# Patient Record
Sex: Male | Born: 1995
Health system: Southern US, Community
[De-identification: ages and names within clinical notes are randomized; demographics above are authoritative.]

---
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
## 2016-05-10 ENCOUNTER — Emergency Department: Payer: Self-pay

## 2016-05-10 ENCOUNTER — Emergency Department
Admission: EM | Admit: 2016-05-10 | Discharge: 2016-05-10 | Disposition: A | Discharge status: Home / Self Care | Payer: Self-pay | Attending: Emergency Medicine | Admitting: Emergency Medicine

## 2016-05-10 DIAGNOSIS — R0602 Shortness of breath: Secondary | ICD-10-CM

## 2016-05-10 DIAGNOSIS — F419 Anxiety disorder, unspecified: Secondary | ICD-10-CM | POA: Insufficient documentation

## 2016-05-10 DIAGNOSIS — F17200 Nicotine dependence, unspecified, uncomplicated: Secondary | ICD-10-CM | POA: Insufficient documentation

## 2016-05-10 LAB — BASIC METABOLIC PANEL
ANION GAP: 8 (ref 5–15)
BUN: 9 mg/dL (ref 6–20)
CALCIUM: 10.1 mg/dL (ref 8.9–10.3)
CO2: 24 mmol/L (ref 22–32)
CREATININE: 0.84 mg/dL (ref 0.61–1.24)
Chloride: 106 mmol/L (ref 101–111)
Glucose, Bld: 106 mg/dL — ABNORMAL HIGH (ref 65–99)
Potassium: 3.4 mmol/L — ABNORMAL LOW (ref 3.5–5.1)
SODIUM: 138 mmol/L (ref 135–145)

## 2016-05-10 LAB — CBC
HCT: 46.4 % (ref 40.0–52.0)
HEMOGLOBIN: 16.5 g/dL (ref 13.0–18.0)
MCH: 32.1 pg (ref 26.0–34.0)
MCHC: 35.6 g/dL (ref 32.0–36.0)
MCV: 90.3 fL (ref 80.0–100.0)
PLATELETS: 227 10*3/uL (ref 150–440)
RBC: 5.14 MIL/uL (ref 4.40–5.90)
RDW: 12.8 % (ref 11.5–14.5)
WBC: 11.8 10*3/uL — AB (ref 3.8–10.6)

## 2016-05-10 LAB — TROPONIN I

## 2016-05-10 MED ORDER — LORAZEPAM 0.5 MG PO TABS: 0.5000 mg | ORAL_TABLET | Freq: Three times a day (TID) | ORAL | 0 refills | Status: AC | PRN

## 2016-05-10 NOTE — ED Triage Notes (Addendum)
Pt arrives to ER via POV c/o SOB that is worse in the morning X 1 week. Pt states SOB worsening with exertion. Pt states 1 episode of this that he was evaluated for previously. Intermittent chest tightness prior to arrival, none at this time. Pt alert and oriented X4, active, cooperative, pt in NAD. RR even and unlabored, color WNL.

## 2016-05-10 NOTE — ED Provider Notes (Signed)
[facility] Emergency Department Provider Note  Time seen: 7:15 PM  I have reviewed the triage vital signs and the nursing notes.   HISTORY  Chief Complaint Shortness of Breath    HPI Charles Hughes is a 20 y.o. male with no past medical history who presents the emergency department for intermittent shortness of breath. According to the patient for the past several weeks he will occasionally become short of breath, get tingling sensations in his hands and feet sometimes accompanied with sweating and feeling like he is going to pass out. Denies any heart palpitations or racing. Denies any chest pain. Mom has a history of anxiety, and believes the patient could be suffering from panic attacks.  History reviewed. No pertinent past medical history.  There are no active problems to display for this patient.   History reviewed. No pertinent surgical history.  Prior to Admission medications   Not on File    No Known Allergies  No family history on file.  Social History Social History  Substance Use Topics  . Smoking status: Current Every Day Smoker  . Smokeless tobacco: Current User  . Alcohol use No    Review of Systems Constitutional: Negative for fever. Cardiovascular: Negative for chest pain. Respiratory: Intermittent shortness of breath. Gastrointestinal: Negative for abdominal pain Musculoskeletal: Negative for back pain. Neurological: Negative for headaches, focal weakness or numbness. 10-point ROS otherwise negative.  ____________________________________________   PHYSICAL EXAM:  VITAL SIGNS: ED Triage Vitals [05/10/16 1742]  Enc Vitals Group     BP (!) 137/103     Pulse Rate 71     Resp 20     Temp 97.8 F (36.6 C)     Temp Source Oral     SpO2 100 %     Weight 140 lb (63.5 kg)     Height 5\' 10"  (1.778 m)     Head Circumference      Peak Flow      Pain Score 0     Pain Loc      Pain Edu?      Excl. in GC?      Constitutional: Alert and oriented. Well appearing and in no distress. Eyes: Normal exam ENT   Head: Normocephalic and atraumatic   Mouth/Throat: Mucous membranes are moist. Cardiovascular: Normal rate, regular rhythm. No murmur Respiratory: Normal respiratory effort without tachypnea nor retractions. Breath sounds are clear Gastrointestinal: Soft and nontender. No distention.   Musculoskeletal: Nontender with normal range of motion in all extremities. No lower extremity tenderness or edema. Neurologic:  Normal speech and language. No gross focal neurologic deficits are appreciated. Skin:  Skin is warm, dry and intact.  Psychiatric: Mood and affect are normal. Speech and behavior are normal.   ____________________________________________    EKG  EKG reviewed and interpreted by myself shows normal sinus rhythm at 60 bpm, narrow QRS, normal axis, normal intervals, no concerning ST changes. Normal EKG.  ____________________________________________    RADIOLOGY  Chest x-ray clear  ____________________________________________   INITIAL IMPRESSION / ASSESSMENT AND PLAN / ED COURSE  Pertinent labs & imaging results that were available during my care of the patient were reviewed by me and considered in my medical decision making (see chart for details).  Patient says emergency department intermittent shortness of breath. Patient is perc negative, labs are normal, troponin negative, chest x-ray and EKG are reassuring. Patient's symptoms could very likely be anxiety related. I discussed with the patient a trial of Ativan to  be used only as needed. I also discussed following up with his primary care doctor, mom states he has appointment on Monday. We will discharge the patient home with PCP follow-up.  ____________________________________________   FINAL CLINICAL IMPRESSION(S) / ED DIAGNOSES  Anxiety Shortness of breath    Minna Antis, MD 05/10/16 1918

## 2017-06-05 ENCOUNTER — Emergency Department
Admission: EM | Admit: 2017-06-05 | Discharge: 2017-06-05 | Disposition: A | Discharge status: Home / Self Care | Payer: Self-pay | Attending: Emergency Medicine | Admitting: Emergency Medicine

## 2017-06-05 ENCOUNTER — Encounter: Payer: Self-pay | Admitting: Emergency Medicine

## 2017-06-05 DIAGNOSIS — F17200 Nicotine dependence, unspecified, uncomplicated: Secondary | ICD-10-CM | POA: Insufficient documentation

## 2017-06-05 DIAGNOSIS — W230XXA Caught, crushed, jammed, or pinched between moving objects, initial encounter: Secondary | ICD-10-CM | POA: Insufficient documentation

## 2017-06-05 DIAGNOSIS — S93402A Sprain of unspecified ligament of left ankle, initial encounter: Secondary | ICD-10-CM | POA: Insufficient documentation

## 2017-06-05 DIAGNOSIS — Y999 Unspecified external cause status: Secondary | ICD-10-CM | POA: Insufficient documentation

## 2017-06-05 DIAGNOSIS — Y939 Activity, unspecified: Secondary | ICD-10-CM | POA: Insufficient documentation

## 2017-06-05 DIAGNOSIS — Y929 Unspecified place or not applicable: Secondary | ICD-10-CM | POA: Insufficient documentation

## 2017-06-05 MED ORDER — IBUPROFEN 800 MG PO TABS: 800.0000 mg | ORAL_TABLET | Freq: Three times a day (TID) | ORAL | 0 refills | Status: DC | PRN

## 2017-06-05 NOTE — ED Provider Notes (Signed)
[facility] Emergency Department Provider Note   ____________________________________________   I have reviewed the triage vital signs and the nursing notes.   HISTORY  Chief Complaint Foot Pain    HPI Charles Hughes is a 21 y.o. male presents to the emergency department with left foot pain. Patient reports his foot getting caught and twisted in a rolling metal cart 3 days ago. Patient reports left foot pain with ecchymosis and mild swelling. He states initially after the injury significant swelling that improved after 48 hours. 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 Patient denies any sensation changes, loss of strength or unsteadiness. Patient denies fever, chills, headache, vision changes, chest pain, chest tightness, shortness of breath, abdominal pain, nausea and vomiting.  No past medical history on file.  There are no active problems to display for this patient.   History reviewed. No pertinent surgical history.  Prior to Admission medications   Medication Sig Start Date End Date Taking? Authorizing Provider  ibuprofen (ADVIL,MOTRIN) 800 MG tablet Take 1 tablet (800 mg total) by mouth every 8 (eight) hours as needed. 06/05/17   Little, Traci M, PA-C    Allergies Patient has no known allergies.  No family history on file.  Social History Social History  Substance Use Topics  . Smoking status: Current Every Day Smoker  . Smokeless tobacco: Current User  . Alcohol use No    Review of Systems Constitutional: Negative for fever/chills Eyes: No visual changes. ENT:  Negative for sore throat and for difficulty swallowing Cardiovascular: Denies chest pain. Respiratory: Denies cough. Denies shortness of breath. Gastrointestinal: No abdominal pain.  No nausea, vomiting, diarrhea. Genitourinary: Negative  for dysuria. Musculoskeletal: Left ankle pain with swelling and ecchymosis Skin: Negative for rash. Neurological: Negative for headaches.  Negative focal weakness or numbness. Negative for loss of consciousness. Able to ambulate. ____________________________________________   PHYSICAL EXAM:  VITAL SIGNS: ED Triage Vitals  Enc Vitals Group     BP 06/05/17 1407 98/75     Pulse Rate 06/05/17 1407 77     Resp 06/05/17 1407 20     Temp 06/05/17 1407 98.7 F (37.1 C)     Temp Source 06/05/17 1407 Oral     SpO2 06/05/17 1407 100 %     Weight 06/05/17 1408 140 lb (63.5 kg)     Height 06/05/17 1408 5\' 11"  (1.803 m)     Head Circumference --      Peak Flow --      Pain Score 06/05/17 1409 8     Pain Loc --      Pain Edu? --      Excl. in GC? --     Constitutional: Alert and oriented. Well appearing and in no acute distress.  Eyes: Conjunctivae are normal. PERRL. EOMI  Head: Normocephalic and atraumatic. ENT:      Ears: Canals clear. TMs intact bilaterally.      Nose: No congestion/rhinnorhea.      Mouth/Throat: Mucous membranes are moist.  Neck:Supple. No thyromegaly. No stridor.  Cardiovascular: Normal rate, regular rhythm. Normal S1 and S2.  Good peripheral circulation. Respiratory: Normal respiratory effort without tachypnea or retractions. Lungs CTAB. No wheezes/rales/rhonchi.  Hematological/Lymphatic/Immunological: No cervical lymphadenopathy. Cardiovascular: Normal rate, regular rhythm. Normal distal pulses. Gastrointestinal: Bowel sounds 4 quadrants. Soft and nontender to palpation. Musculoskeletal: Left ankle  pain localized  Along medial and lateral malleoli with ecchymosis. Intact left ankle range of motion in all planes, sensation and strength. Neurologic: Normal speech and language. No gross focal neurologic deficits are appreciated. No gait instability.  Skin:  Skin is warm, dry and intact. No rash noted. Psychiatric: Mood and affect are normal. Speech and behavior are  normal. Patient exhibits appropriate insight and judgement.  ____________________________________________   LABS (all labs ordered are listed, but only abnormal results are displayed)  Labs Reviewed - No data to display ____________________________________________  EKG none ____________________________________________  RADIOLOGY none ____________________________________________   PROCEDURES  Procedure(s) performed: no    Critical Care performed: no ____________________________________________   INITIAL IMPRESSION / ASSESSMENT AND PLAN / ED COURSE  Pertinent labs & imaging results that were available during my care of the patient were reviewed by me and considered in my medical decision making (see chart for details).   Patient presents to emergency department with left ankle pain, swelling and ecchymosis. History and physical exam findings are reassuring symptoms are consistent with ankle sprain. He should advised to use a lace up to urinate ankle brace for support during walking and mobility activities. Also utilize elevation and ice for symptom management. Patient will be prescribed ibuprofen for pain and inflammation.. Patient advised to follow up with PCP as needed or return to the emergency department if symptoms return or worsen. Patient informed of clinical course, understand medical decision-making process, and agree with plan.    ____________________________________________   FINAL CLINICAL IMPRESSION(S) / ED DIAGNOSES  Final diagnoses:  Sprain of left ankle, unspecified ligament, initial encounter       NEW MEDICATIONS STARTED DURING THIS VISIT:  Discharge Medication List as of 06/05/2017  2:59 PM    START taking these medications   Details  ibuprofen (ADVIL,MOTRIN) 800 MG tablet Take 1 tablet (800 mg total) by mouth every 8 (eight) hours as needed., Starting Tue 06/05/2017, Print         Note:  This document was prepared using Dragon voice  recognition software and may include unintentional dictation errors.    Clois ComberLittle, Traci M, PA-C 06/05/17 1550    Jene EveryKinner, Robert, MD 06/08/17 [phone]

## 2017-06-05 NOTE — ED Notes (Signed)
AAOx3.  Skin warm and dry. Eccymosis seen to left ankle medially and laterally.  Ambulates with easy and steady gait.  NAD

## 2017-06-05 NOTE — ED Notes (Signed)
AAOx3.  Skin warm and dry. NAD.  Ambulates with easy and steady gait. NAD °

## 2017-06-05 NOTE — ED Triage Notes (Signed)
Pt c/o left foot pain since Saturday. Ambulates without difficulty.

## 2017-06-05 NOTE — Discharge Instructions (Signed)
Utilize cold pack, rest and elevation to reduce swelling.  You may use an ankle lace up brace to assist with ankle support when returning to walking and working activities.

## 2017-09-13 ENCOUNTER — Other Ambulatory Visit: Payer: Self-pay

## 2017-09-13 ENCOUNTER — Emergency Department: Payer: Self-pay

## 2017-09-13 ENCOUNTER — Emergency Department
Admission: EM | Admit: 2017-09-13 | Discharge: 2017-09-13 | Disposition: A | Discharge status: Home / Self Care | Payer: Self-pay | Attending: Emergency Medicine | Admitting: Emergency Medicine

## 2017-09-13 DIAGNOSIS — Z87891 Personal history of nicotine dependence: Secondary | ICD-10-CM | POA: Insufficient documentation

## 2017-09-13 DIAGNOSIS — N503 Cyst of epididymis: Secondary | ICD-10-CM | POA: Insufficient documentation

## 2017-09-13 DIAGNOSIS — N50812 Left testicular pain: Secondary | ICD-10-CM

## 2017-09-13 LAB — URINALYSIS, COMPLETE (UACMP) WITH MICROSCOPIC
BACTERIA UA: NONE SEEN
Bilirubin Urine: NEGATIVE
GLUCOSE, UA: NEGATIVE mg/dL
Hgb urine dipstick: NEGATIVE
Ketones, ur: NEGATIVE mg/dL
Leukocytes, UA: NEGATIVE
Nitrite: NEGATIVE
PROTEIN: NEGATIVE mg/dL
Specific Gravity, Urine: 1.024 (ref 1.005–1.030)
Squamous Epithelial / LPF: NONE SEEN
WBC UA: NONE SEEN WBC/hpf (ref 0–5)
pH: 6 (ref 5.0–8.0)

## 2017-09-13 NOTE — ED Notes (Signed)
First nurse note  Presents with some pain /swelling to groin

## 2017-09-13 NOTE — Discharge Instructions (Signed)
Follow-up with the urologist, his phone number has been given to you, use ibuprofen as needed for pain, apply ice to the area, return if your worsening

## 2017-09-13 NOTE — ED Provider Notes (Signed)
[facility] Emergency Department Provider Note  ____________________________________________   First MD Initiated Contact with Patient 09/13/17 1319     (approximate)  I have reviewed the triage vital signs and the nursing notes.   HISTORY  Chief Complaint Groin Swelling    HPI Charles Hughes is a 21 y.o. male states he has had a problem with this over the years, it started when he was about 21 years old, denies fever or chills complains of left testicular swelling with some pain,, denies penile discharge or any chance of STD at this time patient   History reviewed. No pertinent past medical history.  There are no active problems to display for this patient.   History reviewed. No pertinent surgical history.  Prior to Admission medications   Medication Sig Start Date End Date Taking? Authorizing Provider  ibuprofen (ADVIL,MOTRIN) 800 MG tablet Take 1 tablet (800 mg total) by mouth every 8 (eight) hours as needed. 06/05/17   Little, Traci M, PA-C    Allergies Patient has no known allergies.  No family history on file.  Social History Social History   Tobacco Use  . Smoking status: Former Games developermoker  . Smokeless tobacco: Current User  Substance Use Topics  . Alcohol use: No  . Drug use: Not on file    Review of Systems  Constitutional: No fever/chills Eyes: No visual changes. ENT: No sore throat. Respiratory: Denies cough Genitourinary: Negative for dysuria.  Positive for swelling of the left testicle patient Musculoskeletal: Negative for back pain. Skin: Negative for rash.    ____________________________________________   PHYSICAL EXAM:  VITAL SIGNS: ED Triage Vitals  Enc Vitals Group     BP 09/13/17 0842 (!) 143/87     Pulse Rate 09/13/17 0842 (!) 115     Resp 09/13/17 0842 16     Temp 09/13/17 0842 98.1 F (36.7 C)     Temp Source 09/13/17 0842 Oral     SpO2 09/13/17 0842 100 %     Weight 09/13/17 0843 140 lb (63.5 kg)     Height 09/13/17 0843 5\' 11"  (1.803 m)     Head Circumference --      Peak Flow --      Pain Score 09/13/17 0842 8     Pain Loc --      Pain Edu? --      Excl. in GC? --     Constitutional: Alert and oriented. Well appearing and in no acute distress. Eyes: Conjunctivae are normal.  Head: Atraumatic. Nose: No congestion/rhinnorhea. Mouth/Throat: Mucous membranes are moist.   Cardiovascular: Normal rate, regular rhythm.  Heart sounds are normal Respiratory: Normal respiratory effort.  No retractions lungs are clear to auscultation,  GU: .  The left testicle is minimally swollen, is little tender, no abscesses noted no penile discharge is noted Musculoskeletal: FROM all extremities, warm and well perfused Neurologic:  Normal speech and language.  Skin:  Skin is warm, dry and intact. No rash noted. Psychiatric: Mood and affect are normal. Speech and behavior are normal.  ____________________________________________   LABS (all labs ordered are listed, but only abnormal results are displayed)  Labs Reviewed  URINALYSIS, COMPLETE (UACMP) WITH MICROSCOPIC - Abnormal; Notable for the following components:      Result Value   Color, Urine YELLOW (*)    APPearance CLEAR (*)    All other components within normal limits   ____________________________________________   ____________________________________________  RADIOLOGY  Ultrasound of the scrotum shows a  small cyst on the head of the epididymal  ____________________________________________   PROCEDURES  Procedure(s) performed: No      ____________________________________________   INITIAL IMPRESSION / ASSESSMENT AND PLAN / ED COURSE  Pertinent labs & imaging results that were available during my care of the patient were reviewed by me and considered in my medical decision making (see chart for details).  Patient is a 21 year old male with a epididymal cyst, he is to follow-up with urology, he is to call for an  appointment, his ultrasound and lab work were negative,  all I explained the results to him, he states he understands and will comply with our instructions to follow-up with urology, he was discharged in stable condition     ____________________________________________   FINAL CLINICAL IMPRESSION(S) / ED DIAGNOSES  Final diagnoses:  Testicular pain, left  Epididymal cyst      NEW MEDICATIONS STARTED DURING THIS VISIT:  This SmartLink is deprecated. Use AVSMEDLIST instead to display the medication list for a patient.   Note:  This document was prepared using Dragon voice recognition software and may include unintentional dictation errors.    Faythe GheeFisher, John Williamsen W, PA-C 09/13/17 1611    Nita SickleVeronese, [place], MD 09/14/17 2056

## 2017-09-13 NOTE — ED Triage Notes (Signed)
Pt c/o intermittent left testicle pain with swelling for the past 2 days. States he had this problem when he was about 13 with the same testicle.

## 2017-09-21 ENCOUNTER — Encounter: Payer: Self-pay | Admitting: Urology

## 2017-09-21 ENCOUNTER — Ambulatory Visit (INDEPENDENT_AMBULATORY_CARE_PROVIDER_SITE_OTHER): Payer: Self-pay | Admitting: Urology

## 2017-09-21 VITALS — BP 102/64 | HR 97 | Ht 71.0 in | Wt 140.0 lb

## 2017-09-21 DIAGNOSIS — I861 Scrotal varices: Secondary | ICD-10-CM

## 2017-09-21 DIAGNOSIS — N5082 Scrotal pain: Secondary | ICD-10-CM | POA: Insufficient documentation

## 2017-09-21 NOTE — Progress Notes (Signed)
09/21/2017 2:46 PM   Charles Hughes [birth year] [id number]  Referring provider: No referring provider defined for this encounter.  Chief Complaint  Patient presents with  . scrotal cyst    new patient    HPI: 21 year old male referred from the emergency department for evaluation of a left scrotal pain.  He was seen in the ED on 09/13/2017 complaining of onset of left scrotal pain and swelling.  He had a scrotal sonogram which showed normal blood flow bilaterally.  He had a small left epididymal cyst measuring 3 mm.  The patient states he was told that his epididymal cyst was causing his pain and was referred to urology.  He states shortly after puberty he began to have intermittent left scrotal pain and states he was told it was due to "fluid buildup".  His episode significantly decreased however he has intermittent episodes of left scrotal pain which will usually last from 1-4 days.  The pain is described as mild to moderate.  He does note left hemiscrotal swelling and states his scrotum will occasionally be swollen to twice the normal size.  His exam in the ED is noted as the left testicle is minimally swollen however ultrasonic size was normal.  His pain has resolved.   PMH: No past medical history on file.  Surgical History: No past surgical history on file.  Home Medications:  Allergies as of 09/21/2017   No Known Allergies     Medication List    as of 09/21/2017  2:46 PM   You have not been prescribed any medications.     Allergies: No Known Allergies  Family History: No family history on file.  Social History:  reports that he has quit smoking. He uses smokeless tobacco. He reports that he does not drink alcohol. His drug history is not on file.  ROS: UROLOGY Frequent Urination?: Yes Hard to postpone urination?: No Burning/pain with urination?: No Get up at night to urinate?: No Leakage of urine?: No Urine stream starts and stops?: No Trouble starting  stream?: Yes Do you have to strain to urinate?: No Blood in urine?: No Urinary tract infection?: No Sexually transmitted disease?: No Injury to kidneys or bladder?: No Painful intercourse?: No Weak stream?: No Erection problems?: Yes Penile pain?: No  Gastrointestinal Nausea?: No Vomiting?: No Indigestion/heartburn?: Yes Diarrhea?: No Constipation?: No  Constitutional Fever: No Night sweats?: No Weight loss?: Yes Fatigue?: No  Skin Skin rash/lesions?: No Itching?: No  Eyes Blurred vision?: No Double vision?: No  Ears/Nose/Throat Sore throat?: No Sinus problems?: No  Hematologic/Lymphatic Swollen glands?: No Easy bruising?: No  Cardiovascular Leg swelling?: No Chest pain?: No  Respiratory Cough?: No Shortness of breath?: No  Endocrine Excessive thirst?: No  Musculoskeletal Back pain?: No Joint pain?: No  Neurological Headaches?: No Dizziness?: No  Psychologic Depression?: No Anxiety?: No  Physical Exam: BP 102/64   Pulse 97   Ht 5\' 11"  (1.803 m)   Wt 140 lb (63.5 kg)   BMI 19.53 kg/m   Constitutional:  Alert and oriented, No acute distress. HEENT: [place] AT, moist mucus membranes.  Trachea midline, no masses. Cardiovascular: No clubbing, cyanosis, or edema. Respiratory: Normal respiratory effort, no increased work of breathing. GI: Abdomen is soft, nontender, nondistended, no abdominal masses GU: No CVA tenderness.  Penis circumcised without lesions.  Testes descended bilaterally without masses or tenderness.  epididymes palpably normal bilaterally.  Moderate left varicocele. Skin: No rashes, bruises or suspicious lesions. Lymph: No cervical or inguinal adenopathy. Neurologic:  Grossly intact, no focal deficits, moving all 4 extremities. Psychiatric: Normal mood and affect.  Laboratory Data: Lab Results  Component Value Date   WBC 11.8 (H) 05/10/2016   HGB 16.5 05/10/2016   HCT 46.4 05/10/2016   MCV 90.3 05/10/2016   PLT 227 05/10/2016     Lab Results  Component Value Date   CREATININE 0.84 05/10/2016    Pertinent Imaging: Scrotal ultrasound was reviewed.  The left testis is actually slightly smaller than the right.   Assessment & Plan:   He was reassured that his scrotal ultrasound showed no significant abnormalities and that the 3 mm cyst would not be a cause of scrotal pain and does not require treatment.  I did discuss the possibility of varicocele related pain however recommend he return for a repeat exam when he is symptomatic with pain and swelling.  1. Scrotal pain   2. Varicocele    Riki AltesScott C Nastasha Reising, MD  [facility] [address], [address] [place], [place] [id number] [phone]

## 2019-07-08 ENCOUNTER — Other Ambulatory Visit: Payer: Self-pay

## 2019-07-08 ENCOUNTER — Encounter: Payer: Self-pay | Admitting: Emergency Medicine

## 2019-07-08 ENCOUNTER — Emergency Department: Payer: Self-pay

## 2019-07-08 DIAGNOSIS — Z87891 Personal history of nicotine dependence: Secondary | ICD-10-CM | POA: Insufficient documentation

## 2019-07-08 DIAGNOSIS — J40 Bronchitis, not specified as acute or chronic: Secondary | ICD-10-CM | POA: Insufficient documentation

## 2019-07-08 NOTE — ED Triage Notes (Signed)
Patient ambulatory to triage with steady gait, without difficulty or distress noted, mask in place; pt reports since last night having prod cough with some blood noted; denies fever; also reports painful knot to left lower chest, tender to touch with no known injury, st that he believes it is another cyst

## 2019-07-09 ENCOUNTER — Emergency Department
Admission: EM | Admit: 2019-07-09 | Discharge: 2019-07-09 | Disposition: A | Discharge status: Home / Self Care | Payer: Self-pay | Attending: Emergency Medicine | Admitting: Emergency Medicine

## 2019-07-09 ENCOUNTER — Other Ambulatory Visit: Payer: Self-pay

## 2019-07-09 ENCOUNTER — Emergency Department: Payer: Self-pay

## 2019-07-09 DIAGNOSIS — J40 Bronchitis, not specified as acute or chronic: Secondary | ICD-10-CM

## 2019-07-09 LAB — CBC WITH DIFFERENTIAL/PLATELET
Abs Immature Granulocytes: 0.02 10*3/uL (ref 0.00–0.07)
Basophils Absolute: 0.1 10*3/uL (ref 0.0–0.1)
Basophils Relative: 1 %
Eosinophils Absolute: 0.4 10*3/uL (ref 0.0–0.5)
Eosinophils Relative: 5 %
HCT: 47.1 % (ref 39.0–52.0)
Hemoglobin: 16.4 g/dL (ref 13.0–17.0)
Immature Granulocytes: 0 %
Lymphocytes Relative: 29 %
Lymphs Abs: 2.2 10*3/uL (ref 0.7–4.0)
MCH: 31.5 pg (ref 26.0–34.0)
MCHC: 34.8 g/dL (ref 30.0–36.0)
MCV: 90.4 fL (ref 80.0–100.0)
Monocytes Absolute: 0.7 10*3/uL (ref 0.1–1.0)
Monocytes Relative: 10 %
Neutro Abs: 4.1 10*3/uL (ref 1.7–7.7)
Neutrophils Relative %: 55 %
Platelets: 216 10*3/uL (ref 150–400)
RBC: 5.21 MIL/uL (ref 4.22–5.81)
RDW: 12 % (ref 11.5–15.5)
WBC: 7.4 10*3/uL (ref 4.0–10.5)
nRBC: 0 % (ref 0.0–0.2)

## 2019-07-09 LAB — BASIC METABOLIC PANEL
Anion gap: 8 (ref 5–15)
BUN: 13 mg/dL (ref 6–20)
CO2: 28 mmol/L (ref 22–32)
Calcium: 9.2 mg/dL (ref 8.9–10.3)
Chloride: 102 mmol/L (ref 98–111)
Creatinine, Ser: 0.86 mg/dL (ref 0.61–1.24)
GFR calc Af Amer: >60 mL/min (ref >60)
GFR calc non Af Amer: >60 mL/min (ref >60)
Glucose, Bld: 86 mg/dL (ref 70–99)
Potassium: 3.7 mmol/L (ref 3.5–5.1)
Sodium: 138 mmol/L (ref 135–145)

## 2019-07-09 LAB — TROPONIN I (HIGH SENSITIVITY): Troponin I (High Sensitivity): <2 ng/L (ref <18)

## 2019-07-09 MED ORDER — IOHEXOL 350 MG/ML SOLN
100.0000 mL | Freq: Once | INTRAVENOUS | Status: AC | PRN
  Administered 2019-07-09: 100 mL via INTRAVENOUS

## 2019-07-09 MED ORDER — ALBUTEROL SULFATE HFA 108 (90 BASE) MCG/ACT IN AERS: 2.0000 | INHALATION_SPRAY | Freq: Four times a day (QID) | RESPIRATORY_TRACT | 0 refills | Status: DC | PRN

## 2019-07-09 MED ORDER — PREDNISONE 20 MG PO TABS
60.0000 mg | ORAL_TABLET | Freq: Once | ORAL | Status: AC
  Administered 2019-07-09: 60 mg via ORAL
  Filled 2019-07-09: qty 3

## 2019-07-09 MED ORDER — PREDNISONE 20 MG PO TABS: 60.0000 mg | ORAL_TABLET | Freq: Every day | ORAL | 0 refills | Status: AC

## 2019-07-09 NOTE — ED Provider Notes (Signed)
[facility] Emergency Department Provider Note  ____________________________________________  Time seen: Approximately 12:51 AM  I have reviewed the triage vital signs and the nursing notes.   HISTORY  Chief Complaint Cough   HPI Charles Hughes is a 23 y.o. male presents for evaluation of hemoptysis.  Patient reports intermittent left-sided chest pressure.  Has had a mild cough.  Yesterday he reports bringing up a tablespoon of sputum which was full with blood.  No blood clots.  Today he reports that he sputum looks brown but has not seen any further blood in it.  He has no chest pain at this time.  No personal or family history of blood clots, recent travel immobilization, leg pain or swelling, or exogenous hormones.  Patient is a former smoker and stopped 2 years ago.  He denies drug use or alcohol.  He denies vomiting, abdominal pain, melena, shortness of breath, fever, loss of taste or smell, body aches, sore throat.   History reviewed. No pertinent past medical history.  Patient Active Problem List   Diagnosis Date Noted  . Scrotal pain 09/21/2017  . Varicocele 09/21/2017    History reviewed. No pertinent surgical history.  Prior to Admission medications   Medication Sig Start Date End Date Taking? Authorizing Provider  albuterol (VENTOLIN HFA) 108 (90 Base) MCG/ACT inhaler Inhale 2 puffs into the lungs every 6 (six) hours as needed for wheezing or shortness of breath. 07/09/19   Nita Sickle, MD  predniSONE (DELTASONE) 20 MG tablet Take 3 tablets (60 mg total) by mouth daily for 4 days. 07/09/19 07/13/19  Nita Sickle, MD    Allergies Patient has no known allergies.  No family history on file.  Social History Social History   Tobacco Use  . Smoking status: Former Games developer  . Smokeless tobacco: Current User  Substance Use Topics  . Alcohol use: No  . Drug use: Not on file    Review of Systems  Constitutional: Negative for  fever. Eyes: Negative for visual changes. ENT: Negative for sore throat. Neck: No neck pain  Cardiovascular: Negative for chest pain. Respiratory: Negative for shortness of breath. + hemoptysis Gastrointestinal: Negative for abdominal pain, vomiting or diarrhea. Genitourinary: Negative for dysuria. Musculoskeletal: Negative for back pain. Skin: Negative for rash. Neurological: Negative for headaches, weakness or numbness. Psych: No SI or HI  ____________________________________________   PHYSICAL EXAM:  VITAL SIGNS: ED Triage Vitals  Enc Vitals Group     BP 07/08/19 2213 119/88     Pulse Rate 07/08/19 2213 81     Resp 07/08/19 2213 18     Temp 07/08/19 2213 98.4 F (36.9 C)     Temp Source 07/08/19 2213 Oral     SpO2 07/08/19 2213 99 %     Weight 07/08/19 2213 150 lb (68 kg)     Height 07/08/19 2213 5\' 10"  (1.778 m)     Head Circumference --      Peak Flow --      Pain Score 07/08/19 2212 6     Pain Loc --      Pain Edu? --      Excl. in [place]? --     Constitutional: Alert and oriented. Well appearing and in no apparent distress. HEENT:      Head: Normocephalic and atraumatic.         Eyes: Conjunctivae are normal. Sclera is non-icteric.       Mouth/Throat: Mucous membranes are moist.  Neck: Supple with no signs of meningismus. Cardiovascular: Regular rate and rhythm. No murmurs, gallops, or rubs. 2+ symmetrical distal pulses are present in all extremities. No JVD. Respiratory: Normal respiratory effort. Lungs are clear to auscultation bilaterally. No wheezes, crackles, or rhonchi.  Gastrointestinal: Soft, non tender, and non distended with positive bowel sounds. No rebound or guarding. Musculoskeletal: Nontender with normal range of motion in all extremities. No edema, cyanosis, or erythema of extremities. Neurologic: Normal speech and language. Face is symmetric. Moving all extremities. No gross focal neurologic deficits are appreciated. Skin: Skin is warm, dry  and intact. No rash noted. Psychiatric: Mood and affect are normal. Speech and behavior are normal.  ____________________________________________   LABS (all labs ordered are listed, but only abnormal results are displayed)  Labs Reviewed  CBC WITH DIFFERENTIAL/PLATELET  BASIC METABOLIC PANEL  TROPONIN I (HIGH SENSITIVITY)   ____________________________________________  EKG  ED ECG REPORT I, Nita Sicklearolina Eames Dibiasio, the attending physician, personally viewed and interpreted this ECG.  Normal sinus rhythm, rate of 76, normal intervals, normal axis, no ST elevations or depressions. ____________________________________________  RADIOLOGY  I have personally reviewed the images performed during this visit and I agree with the Radiologist's read.   Interpretation by Radiologist:  Dg Chest 2 View  Result Date: 07/08/2019 CLINICAL DATA:  Cough EXAM: CHEST - 2 VIEW COMPARISON:  May 10, 2016 FINDINGS: The heart size and mediastinal contours are within normal limits. Both lungs are clear. The visualized skeletal structures are unremarkable. IMPRESSION: No acute cardiopulmonary process. Electronically Signed   By: Jonna ClarkBindu  Avutu M.D.   On: 07/08/2019 22:52   Ct Angio Chest Pe W And/or Wo Contrast  Result Date: 07/09/2019 CLINICAL DATA:  Hemoptysis cough EXAM: CT ANGIOGRAPHY CHEST WITH CONTRAST TECHNIQUE: Multidetector CT imaging of the chest was performed using the standard protocol during bolus administration of intravenous contrast. Multiplanar CT image reconstructions and MIPs were obtained to evaluate the vascular anatomy. CONTRAST:  100mL OMNIPAQUE IOHEXOL 350 MG/ML SOLN COMPARISON:  Chest x-ray 07/08/2019 FINDINGS: Cardiovascular: Satisfactory opacification of the pulmonary arteries to the segmental level. No evidence of pulmonary embolism. Normal heart size. No pericardial effusion. Nonaneurysmal aorta. Mediastinum/Nodes: No enlarged mediastinal, hilar, or axillary lymph nodes. Thyroid gland,  trachea, and esophagus demonstrate no significant findings. Lungs/Pleura: Lungs are clear. No pleural effusion or pneumothorax. Upper Abdomen: No acute abnormality. Musculoskeletal: No chest wall abnormality. No acute or significant osseous findings. Review of the MIP images confirms the above findings. IMPRESSION: Negative. No CT evidence for acute pulmonary embolus. Clear lung fields Electronically Signed   By: Jasmine PangKim  Fujinaga M.D.   On: 07/09/2019 02:35     ____________________________________________   PROCEDURES  Procedure(s) performed: None Procedures Critical Care performed:  None ____________________________________________   INITIAL IMPRESSION / ASSESSMENT AND PLAN / ED COURSE  23 y.o. male presents for evaluation of hemoptysis.  Patient is well-appearing in no distress with normal vital signs, normal work of breathing, normal sats, lungs are clear to auscultation.  Differential diagnoses including bronchitis versus pneumonia versus PE versus malignancy.   Chest x-ray unremarkable.  Will perform a CT scan of the chest.  EKG with no ischemic changes.  Patient is hemodynamically stable with normal hemoglobin.  _________________________ 2:42 AM on 07/09/2019 -----------------------------------------  CTA negative for PE or PNA. Presentation concerning for bronchitis, will dc on prednisone and albuterol inhaler. Will hold off abx since patient has no infiltrate, no fever, no leukocytosis. Discussed my standard return precautions and close follow-up with PCP.  As part of my medical decision making, I reviewed the following data within the [place] notes reviewed and incorporated, Labs reviewed , EKG interpreted , Old chart reviewed, Radiograph reviewed , Notes from prior ED visits and [place] Controlled Substance Database   Patient was evaluated in Emergency Department today for the symptoms described in the history of present illness. Patient was  evaluated in the context of the global COVID-19 pandemic, which necessitated consideration that the patient might be at risk for infection with the SARS-CoV-2 virus that causes COVID-19. Institutional protocols and algorithms that pertain to the evaluation of patients at risk for COVID-19 are in a state of rapid change based on information released by regulatory bodies including the CDC and federal and state organizations. These policies and algorithms were followed during the patient's care in the ED.   ____________________________________________   FINAL CLINICAL IMPRESSION(S) / ED DIAGNOSES   Final diagnoses:  Bronchitis      NEW MEDICATIONS STARTED DURING THIS VISIT:  ED Discharge Orders         Ordered    predniSONE (DELTASONE) 20 MG tablet  Daily     07/09/19 0241    albuterol (VENTOLIN HFA) 108 (90 Base) MCG/ACT inhaler  Every 6 hours PRN     07/09/19 0241           Note:  This document was prepared using Dragon voice recognition software and may include unintentional dictation errors.    Rudene Re, MD 07/09/19 [id number]

## 2019-10-12 ENCOUNTER — Emergency Department
Admission: EM | Admit: 2019-10-12 | Discharge: 2019-10-12 | Disposition: A | Discharge status: Home / Self Care | Payer: Self-pay | Attending: Emergency Medicine | Admitting: Emergency Medicine

## 2019-10-12 ENCOUNTER — Emergency Department: Payer: Self-pay

## 2019-10-12 ENCOUNTER — Other Ambulatory Visit: Payer: Self-pay

## 2019-10-12 ENCOUNTER — Encounter: Payer: Self-pay | Admitting: Emergency Medicine

## 2019-10-12 DIAGNOSIS — K5909 Other constipation: Secondary | ICD-10-CM | POA: Insufficient documentation

## 2019-10-12 DIAGNOSIS — Z87891 Personal history of nicotine dependence: Secondary | ICD-10-CM | POA: Insufficient documentation

## 2019-10-12 DIAGNOSIS — Z79899 Other long term (current) drug therapy: Secondary | ICD-10-CM | POA: Insufficient documentation

## 2019-10-12 DIAGNOSIS — R1032 Left lower quadrant pain: Secondary | ICD-10-CM | POA: Insufficient documentation

## 2019-10-12 MED ORDER — FLEET ENEMA 7-19 GM/118ML RE ENEM
1.0000 | ENEMA | Freq: Once | RECTAL | Status: AC
  Administered 2019-10-12: 1 via RECTAL

## 2019-10-12 MED ORDER — MAGNESIUM CITRATE PO SOLN
1.0000 | Freq: Once | ORAL | Status: AC
  Administered 2019-10-12: 11:00:00 1 via ORAL
  Filled 2019-10-12: qty 296

## 2019-10-12 NOTE — ED Provider Notes (Signed)
[facility] Emergency Department Provider Note ____________________________________________  Time seen: 0955  I have reviewed the triage vital signs and the nursing notes.  HISTORY  Chief Complaint  Abdominal Pain and Constipation   HPI Charles Hughes is a 24 y.o. male presents to the ER today with c/o abdominal pain and constipation. This started 4 days ago.  He describes the abdominal pain as bloating and pressure.  He denies nausea or vomiting.  He does have some reflux but this is not abnormal for him.  He takes Tums daily for this.  He has been passing gas.  He has no history of constipation prior.  He has not seen any blood in his stool.  He denies prior history of bowel obstruction.  He has not taken any medication OTC for constipation prior to arrival.  History reviewed. No pertinent past medical history.  Patient Active Problem List   Diagnosis Date Noted  . Scrotal pain 09/21/2017  . Varicocele 09/21/2017    History reviewed. No pertinent surgical history.  Prior to Admission medications   Medication Sig Start Date End Date Taking? Authorizing Provider  albuterol (VENTOLIN HFA) 108 (90 Base) MCG/ACT inhaler Inhale 2 puffs into the lungs every 6 (six) hours as needed for wheezing or shortness of breath. 07/09/19   Rudene Re, MD    Allergies Patient has no known allergies.  No family history on file.  Social History Social History   Tobacco Use  . Smoking status: Former Research scientist (life sciences)  . Smokeless tobacco: Current User  Substance Use Topics  . Alcohol use: No  . Drug use: Not Currently    Review of Systems  Constitutional: Negative for fever, chills or body aches. Cardiovascular: Negative for chest pain or chest tightness. Respiratory: Negative for cough or shortness of breath. Gastrointestinal: Positive for abdominal pain and constipation.  Negative for nausea vomiting, diarrhea or blood in the stool. Genitourinary: Negative for  urinary urgency, frequency or dysuria. ____________________________________________  PHYSICAL EXAM:  VITAL SIGNS: ED Triage Vitals  Enc Vitals Group     BP 10/12/19 0929 138/81     Pulse Rate 10/12/19 0929 76     Resp 10/12/19 0929 16     Temp 10/12/19 0929 98.1 F (36.7 C)     Temp Source 10/12/19 0929 Oral     SpO2 10/12/19 0929 96 %     Weight 10/12/19 0930 150 lb (68 kg)     Height 10/12/19 0930 5\' 11"  (1.803 m)     Head Circumference --      Peak Flow --      Pain Score 10/12/19 0930 6     Pain Loc --      Pain Edu? --      Excl. in [place]? --     Constitutional: Alert and oriented. Well appearing and in no distress. Cardiovascular: Normal rate, regular rhythm. Normal distal pulses. Respiratory: Normal respiratory effort. No wheezes/rales/rhonchi. Gastrointestinal: Soft.  Tender in the left upper and left lower quadrant.  Hypoactive bowel sounds.  Dullness to percussion throughout all 4 quadrants. Neurologic:  Normal speech and language. No gross focal neurologic deficits are appreciated. ____________________________________________   RADIOLOGY   Imaging Orders     DG Abdomen 1 View IMPRESSION:  Moderate stool in colon. No bowel obstruction or free air.  Visualized lung bases clear.    ____________________________________________   INITIAL IMPRESSION / ASSESSMENT AND PLAN / ED COURSE  Abdominal Pain, Constipation:  KUB shows moderate amount of stool  in the colon. Magnesium Citrate 1 bottle in ER No results with Mag Citrate- will try Fleets enema Had BM after enema- pain improved Will have him try Mirilax 17 gm PO every other day as needed for constipation  ____________________________________________  FINAL CLINICAL IMPRESSION(S) / ED DIAGNOSES  Final diagnoses:  Other constipation  Left lower quadrant abdominal pain   Nicki Reaper, NP    Lorre Munroe, NP 10/12/19 1322    Minna Antis, MD 10/12/19 1521

## 2019-10-12 NOTE — ED Notes (Signed)
Patient with BM noted; Enema effective, provider made aware.

## 2019-10-12 NOTE — ED Notes (Signed)
Still unable to have a BM  Provider aware

## 2019-10-12 NOTE — ED Notes (Signed)
See triage note  Presents with some abd pain  States pain started about 1 week ago  No fever or n/v  States last BM was about 4 days ago  Describes pain as cramping

## 2019-10-12 NOTE — Discharge Instructions (Addendum)
You were seen today for abdominal pain secondary to constipation.  You can try MiraLAX 17 g every other day as needed to prevent constipation at home.  Try to increase your water intake to at least 48 ounces of water daily.

## 2019-10-12 NOTE — ED Triage Notes (Addendum)
Pt to ED via POV c/o abdominal cramping x 1 week. Pt states that he has not had BM in 4 days. Has tried OTC medication without relief. Pt is in NAD.

## 2021-04-01 ENCOUNTER — Ambulatory Visit
Admission: EM | Admit: 2021-04-01 | Discharge: 2021-04-01 | Disposition: A | Discharge status: Home / Self Care | Payer: Self-pay | Attending: Emergency Medicine | Admitting: Emergency Medicine

## 2021-04-01 ENCOUNTER — Other Ambulatory Visit: Payer: Self-pay

## 2021-04-01 DIAGNOSIS — J069 Acute upper respiratory infection, unspecified: Secondary | ICD-10-CM

## 2021-04-01 MED ORDER — BENZONATATE 100 MG PO CAPS: 200.0000 mg | ORAL_CAPSULE | Freq: Three times a day (TID) | ORAL | 0 refills | Status: DC

## 2021-04-01 MED ORDER — IPRATROPIUM BROMIDE 0.06 % NA SOLN: 2.0000 | Freq: Four times a day (QID) | NASAL | 12 refills | Status: AC

## 2021-04-01 MED ORDER — PROMETHAZINE-DM 6.25-15 MG/5ML PO SYRP: 5.0000 mL | ORAL_SOLUTION | Freq: Four times a day (QID) | ORAL | 0 refills | Status: AC | PRN

## 2021-04-01 NOTE — ED Triage Notes (Signed)
Patient states that he has been having a cough with nasal congestion x 9 days.

## 2021-04-01 NOTE — Discharge Instructions (Addendum)

## 2021-04-01 NOTE — ED Provider Notes (Signed)
[facility]    CSN: [id number] Arrival date & time: 04/01/21  1215      History   Chief Complaint Chief Complaint  Patient presents with   Cough    HPI Charles Hughes is a 25 y.o. male.   HPI  89 old male here for evaluation of nasal congestion and cough.  Patient reports that he has had a nonproductive cough with runny nose and nasal congestion for clear mucus, scratchy sore throat for the last 9 days.  He denies fever or wheezing.  His son has similar symptoms.  History reviewed. No pertinent past medical history.  Patient Active Problem List   Diagnosis Date Noted   Scrotal pain 09/21/2017   Varicocele 09/21/2017    History reviewed. No pertinent surgical history.     Home Medications    Prior to Admission medications   Medication Sig Start Date End Date Taking? Authorizing Provider  benzonatate (TESSALON) 100 MG capsule Take 2 capsules (200 mg total) by mouth every 8 (eight) hours. 04/01/21  Yes Becky Augusta, NP  ipratropium (ATROVENT) 0.06 % nasal spray Place 2 sprays into both nostrils 4 (four) times daily. 04/01/21  Yes Becky Augusta, NP  promethazine-dextromethorphan (PROMETHAZINE-DM) 6.25-15 MG/5ML syrup Take 5 mLs by mouth 4 (four) times daily as needed. 04/01/21  Yes Becky Augusta, NP    Family History History reviewed. No pertinent family history.  Social History Social History   Tobacco Use   Smoking status: Former    Pack years: 0.00   Smokeless tobacco: Current  Vaping Use   Vaping Use: Never used  Substance Use Topics   Alcohol use: No   Drug use: Not Currently     Allergies   Patient has no known allergies.   Review of Systems Review of Systems  Constitutional:  Negative for activity change, appetite change and fever.  HENT:  Positive for congestion, rhinorrhea and sore throat.   Respiratory:  Positive for cough. Negative for shortness of breath and wheezing.   Hematological: Negative.   Psychiatric/Behavioral: Negative.       Physical Exam Triage Vital Signs ED Triage Vitals  Enc Vitals Group     BP 04/01/21 1251 (!) 101/56     Pulse Rate 04/01/21 1251 76     Resp 04/01/21 1251 16     Temp 04/01/21 1251 98.2 F (36.8 C)     Temp Source 04/01/21 1251 Oral     SpO2 04/01/21 1251 100 %     Weight 04/01/21 1250 135 lb (61.2 kg)     Height 04/01/21 1250 5\' 11"  (1.803 m)     Head Circumference --      Peak Flow --      Pain Score 04/01/21 1250 6     Pain Loc --      Pain Edu? --      Excl. in [place]? --    No data found.  Updated Vital Signs BP (!) 101/56 (BP Location: Right Arm)   Pulse 76   Temp 98.2 F (36.8 C) (Oral)   Resp 16   Ht 5\' 11"  (1.803 m)   Wt 135 lb (61.2 kg)   SpO2 100%   BMI 18.83 kg/m   Visual Acuity Right Eye Distance:   Left Eye Distance:   Bilateral Distance:    Right Eye Near:   Left Eye Near:    Bilateral Near:     Physical Exam Vitals and nursing note reviewed.  Constitutional:  General: He is not in acute distress.    Appearance: Normal appearance. He is normal weight. He is not ill-appearing.  HENT:     Head: Normocephalic and atraumatic.     Right Ear: Tympanic membrane, ear canal and external ear normal. There is no impacted cerumen.     Left Ear: Tympanic membrane, ear canal and external ear normal. There is no impacted cerumen.     Nose: Congestion and rhinorrhea present.     Mouth/Throat:     Mouth: Mucous membranes are moist.     Pharynx: Oropharynx is clear. Posterior oropharyngeal erythema present.  Cardiovascular:     Rate and Rhythm: Normal rate and regular rhythm.     Pulses: Normal pulses.     Heart sounds: Normal heart sounds. No murmur heard.   No gallop.  Pulmonary:     Effort: Pulmonary effort is normal.     Breath sounds: Normal breath sounds. No wheezing, rhonchi or rales.  Musculoskeletal:     Cervical back: Normal range of motion and neck supple.  Lymphadenopathy:     Cervical: No cervical adenopathy.  Skin:    General: Skin  is warm and dry.     Capillary Refill: Capillary refill takes less than 2 seconds.     Findings: No erythema or rash.  Neurological:     General: No focal deficit present.     Mental Status: He is alert and oriented to person, place, and time.  Psychiatric:        Mood and Affect: Mood normal.        Behavior: Behavior normal.        Thought Content: Thought content normal.        Judgment: Judgment normal.     UC Treatments / Results  Labs (all labs ordered are listed, but only abnormal results are displayed) Labs Reviewed - No data to display  EKG   Radiology No results found.  Procedures Procedures (including critical care time)  Medications Ordered in UC Medications - No data to display  Initial Impression / Assessment and Plan / UC Course  I have reviewed the triage vital signs and the nursing notes.  Pertinent labs & imaging results that were available during my care of the patient were reviewed by me and considered in my medical decision making (see chart for details).  Patient is a nontoxic-appearing 25 year old male here for the evaluation of nasal congestion and cough as outlined in HPI above.  Patient's physical exam reveals pearly gray tympanic membranes bilaterally with a normal light reflex and clear external auditory canals.  Nasal mucosa is erythematous and edematous with scant clear nasal discharge.  Oropharyngeal exam reveals posterior oropharyngeal erythema and clear postnasal drip.  No cervical lymphadenopathy appreciable exam.  Cardiopulmonary exam is benign.  Patient exam is consistent with URI with cough.  We will treat patient with Atrovent nasal spray, Tessalon Perles, Promethazine DM cough syrup.   Final Clinical Impressions(s) / UC Diagnoses   Final diagnoses:  Viral URI with cough     Discharge Instructions      Use the Atrovent nasal spray, 2 squirts in each nostril every 6 hours, as needed for runny nose and postnasal drip.  Use the  Tessalon Perles every 8 hours during the day.  Take them with a small sip of water.  They may give you some numbness to the base of your tongue or a metallic taste in your mouth, this is normal.  Use the Promethazine  DM cough syrup at bedtime for cough and congestion.  It will make you drowsy so do not take it during the day.  Return for reevaluation or see your primary care provider for any new or worsening symptoms.      ED Prescriptions     Medication Sig Dispense Auth. Provider   benzonatate (TESSALON) 100 MG capsule Take 2 capsules (200 mg total) by mouth every 8 (eight) hours. 21 capsule Becky Augusta, NP   ipratropium (ATROVENT) 0.06 % nasal spray Place 2 sprays into both nostrils 4 (four) times daily. 15 mL Becky Augusta, NP   promethazine-dextromethorphan (PROMETHAZINE-DM) 6.25-15 MG/5ML syrup Take 5 mLs by mouth 4 (four) times daily as needed. 118 mL Becky Augusta, NP      PDMP not reviewed this encounter.   Becky Augusta, NP 04/01/21 1347

## 2021-07-12 ENCOUNTER — Other Ambulatory Visit: Payer: Self-pay

## 2021-07-12 ENCOUNTER — Ambulatory Visit
Admission: RE | Admit: 2021-07-12 | Discharge: 2021-07-12 | Disposition: A | Discharge status: Home / Self Care | Payer: Self-pay | Source: Ambulatory Visit | Attending: Physician Assistant | Admitting: Physician Assistant

## 2021-07-12 VITALS — BP 98/82 | HR 78 | Temp 98.0°F | Resp 16 | Ht 71.0 in | Wt 135.0 lb

## 2021-07-12 DIAGNOSIS — J019 Acute sinusitis, unspecified: Secondary | ICD-10-CM

## 2021-07-12 DIAGNOSIS — R42 Dizziness and giddiness: Secondary | ICD-10-CM

## 2021-07-12 DIAGNOSIS — R0981 Nasal congestion: Secondary | ICD-10-CM

## 2021-07-12 DIAGNOSIS — B9789 Other viral agents as the cause of diseases classified elsewhere: Secondary | ICD-10-CM

## 2021-07-12 MED ORDER — MECLIZINE HCL 25 MG PO TABS: 25.0000 mg | ORAL_TABLET | Freq: Three times a day (TID) | ORAL | 0 refills | Status: AC | PRN

## 2021-07-12 NOTE — ED Triage Notes (Signed)
Pt. C/o sinus pressure,cough and blurry vision for 3 days. Pt has tried cough medicine.

## 2021-07-12 NOTE — Discharge Instructions (Signed)
-  Your symptoms are consistent with viral sinusitis.  Purchase over-the-counter Flonase and start using that as well as Mucinex D.  Increase rest and fluids. -Take the meclizine if needed for dizziness. -If dizziness worsens you should be seen again.careful with position changes. -For any severe acute worsening of any your symptoms, please call 911 or go to the emergency department.

## 2021-07-12 NOTE — ED Provider Notes (Addendum)
[facility]    CSN: [id number] Arrival date & time: 07/12/21  1301      History   Chief Complaint Chief Complaint  Patient presents with   sinus pressure    HPI Charles Hughes is a 25 y.o. male presenting for 3-day history of nasal congestion, sinus pressure and dizziness when he leans his head down or moves too quickly.  Patient says his vision will start to get blurred.  He denies any fever, fatigue, achiness.  Mild cough.  Denies sore throat.  No severe headaches and denies syncope, vomiting or diarrhea.  Patient says his brother-in-law is ill with similar symptoms.  Patient did a at home COVID test which was negative.  Admits to personal history of COVID-19 3 months ago.  Patient has not been taking any medication for his symptoms.  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  Admits he only feels dizzy with certain head movements.  He is otherwise healthy.  No other complaints.  HPI  History reviewed. No pertinent past medical history.  Patient Active Problem List   Diagnosis Date Noted   Scrotal pain 09/21/2017   Varicocele 09/21/2017    History reviewed. No pertinent surgical history.     Home Medications    Prior to Admission medications   Medication Sig Start Date End Date Taking? Authorizing Provider  ipratropium (ATROVENT) 0.06 % nasal spray Place 2 sprays into both nostrils 4 (four) times daily. 04/01/21  Yes Becky Augusta, NP  meclizine (ANTIVERT) 25 MG tablet Take 1 tablet (25 mg total) by mouth 3 (three) times daily as needed for up to 5 days for dizziness. 07/12/21 07/17/21 Yes Shirlee Latch, PA-C  promethazine-dextromethorphan (PROMETHAZINE-DM) 6.25-15 MG/5ML syrup Take 5 mLs by mouth 4 (four) times daily as needed. 04/01/21  Yes Becky Augusta, NP    Family History History reviewed. No pertinent family history.  Social History Social History   Tobacco Use   Smoking  status: Never   Smokeless tobacco: Current    Types: Chew  Vaping Use   Vaping Use: Never used  Substance Use Topics   Alcohol use: No   Drug use: Not Currently     Allergies   Patient has no known allergies.   Review of Systems Review of Systems  Constitutional:  Negative for fatigue and fever.  HENT:  Positive for congestion, rhinorrhea and sinus pressure. Negative for sore throat.   Respiratory:  Positive for cough. Negative for shortness of breath.   Cardiovascular:  Negative for chest pain.  Gastrointestinal:  Negative for abdominal pain, diarrhea, nausea and vomiting.  Musculoskeletal:  Negative for myalgias.  Neurological:  Positive for dizziness and headaches. Negative for weakness and light-headedness.  Hematological:  Negative for adenopathy.    Physical Exam Triage Vital Signs ED Triage Vitals  Enc Vitals Group     BP 07/12/21 1403 98/82     Pulse Rate 07/12/21 1403 78     Resp 07/12/21 1403 16     Temp 07/12/21 1403 98 F (36.7 C)     Temp Source 07/12/21 1403 Oral     SpO2 07/12/21 1403 100 %     Weight 07/12/21 1358 135 lb (61.2 kg)     Height 07/12/21 1358 5\' 11"  (1.803 m)     Head Circumference --      Peak Flow --      Pain Score 07/12/21 1357 0  Pain Loc --      Pain Edu? --      Excl. in GC? --    No data found.  Updated Vital Signs BP 98/82 (BP Location: Left Arm)   Pulse 78   Temp 98 F (36.7 C) (Oral)   Resp 16   Ht 5\' 11"  (1.803 m)   Wt 135 lb (61.2 kg)   SpO2 100%   BMI 18.83 kg/m      Physical Exam Vitals and nursing note reviewed.  Constitutional:      General: He is not in acute distress.    Appearance: Normal appearance. He is well-developed. He is not ill-appearing or diaphoretic.  HENT:     Head: Normocephalic and atraumatic.     Right Ear: Ear canal and external ear normal. A middle ear effusion is present.     Left Ear: Ear canal and external ear normal. A middle ear effusion is present.     Nose: Congestion  present.     Mouth/Throat:     Mouth: Mucous membranes are moist.     Pharynx: Oropharynx is clear. Uvula midline.     Tonsils: No tonsillar abscesses.  Eyes:     General: No scleral icterus.       Right eye: No discharge.        Left eye: No discharge.     Conjunctiva/sclera: Conjunctivae normal.  Neck:     Thyroid: No thyromegaly.     Trachea: No tracheal deviation.  Cardiovascular:     Rate and Rhythm: Normal rate and regular rhythm.     Heart sounds: Normal heart sounds.  Pulmonary:     Effort: Pulmonary effort is normal. No respiratory distress.     Breath sounds: Normal breath sounds. No wheezing, rhonchi or rales.  Musculoskeletal:     Cervical back: Normal range of motion and neck supple.  Lymphadenopathy:     Cervical: No cervical adenopathy.  Skin:    General: Skin is warm and dry.     Findings: No rash.  Neurological:     General: No focal deficit present.     Mental Status: He is alert. Mental status is at baseline.     Motor: No weakness.     Coordination: Coordination normal.     Gait: Gait normal.  Psychiatric:        Mood and Affect: Mood normal.        Behavior: Behavior normal.        Thought Content: Thought content normal.     UC Treatments / Results  Labs (all labs ordered are listed, but only abnormal results are displayed) Labs Reviewed - No data to display  EKG   Radiology No results found.  Procedures Procedures (including critical care time)  Medications Ordered in UC Medications - No data to display  Initial Impression / Assessment and Plan / UC Course  I have reviewed the triage vital signs and the nursing notes.  Pertinent labs & imaging results that were available during my care of the patient were reviewed by me and considered in my medical decision making (see chart for details).  25 year old male presenting for nasal congestion, cough and sinus pressure as well as feeling dizzy for the past couple of days.  Vitals all  normal and stable and he is overall well-appearing.  Clinical presentation consistent with viral sinusitis.  I have sent in meclizine for dizziness as needed but advised him to try Flonase and Mucinex D first  and increase rest and fluids.  Work note given for the next couple of days and advised him not to operate any heavy machinery if he continues to feel dizzy.  For any worsening of the dizziness he should be seen again.  ED precautions reviewed.  Final Clinical Impressions(s) / UC Diagnoses   Final diagnoses:  Acute viral sinusitis  Nasal congestion  Dizziness     Discharge Instructions      -Your symptoms are consistent with viral sinusitis.  Purchase over-the-counter Flonase and start using that as well as Mucinex D.  Increase rest and fluids. -Take the meclizine if needed for dizziness. -If dizziness worsens you should be seen again.careful with position changes. -For any severe acute worsening of any your symptoms, please call 911 or go to the emergency department.     ED Prescriptions     Medication Sig Dispense Auth. Provider   meclizine (ANTIVERT) 25 MG tablet Take 1 tablet (25 mg total) by mouth 3 (three) times daily as needed for up to 5 days for dizziness. 15 tablet Gareth Morgan      PDMP not reviewed this encounter.   Shirlee Latch, PA-C 07/12/21 1515    Eusebio Friendly B, PA-C 07/12/21 1516

## 2021-10-03 IMAGING — CT CT ANGIO CHEST
2 of 6 series · 19 of 46 positions shown · IV contrast (APPLIED)
Comparison: Chest x-ray 07/08/2019

CLINICAL DATA: Hemoptysis cough

EXAM:
CT ANGIOGRAPHY CHEST WITH CONTRAST
TECHNIQUE: Multidetector CT imaging of the chest was performed using the
standard protocol during bolus administration of intravenous
contrast. Multiplanar CT image reconstructions and MIPs were
obtained to evaluate the vascular anatomy.
CONTRAST:  100mL OMNIPAQUE IOHEXOL 350 MG/ML SOLN

[Series 5: thins · axial · 0.62mm/px · z∈[-572,-299]mm · 16 of 301 slices shown]
[im 14/301  lung]
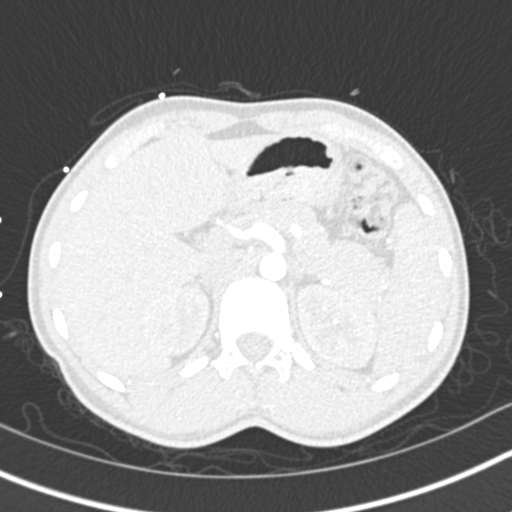
[im 40/301  soft-tissue]
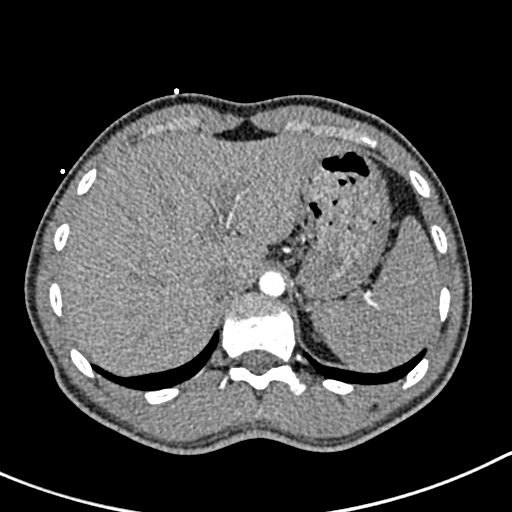
[im 53/301  lung]
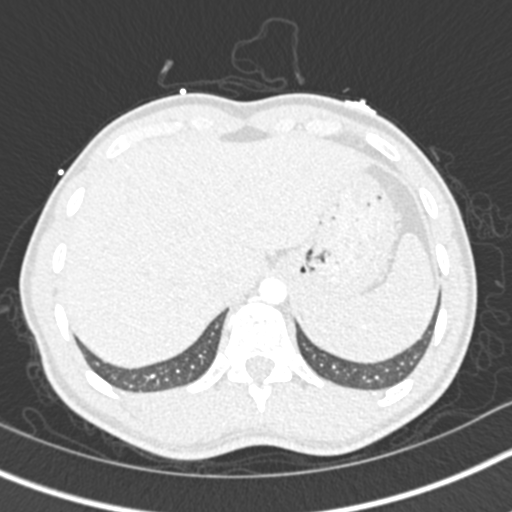
[im 66/301  soft-tissue]
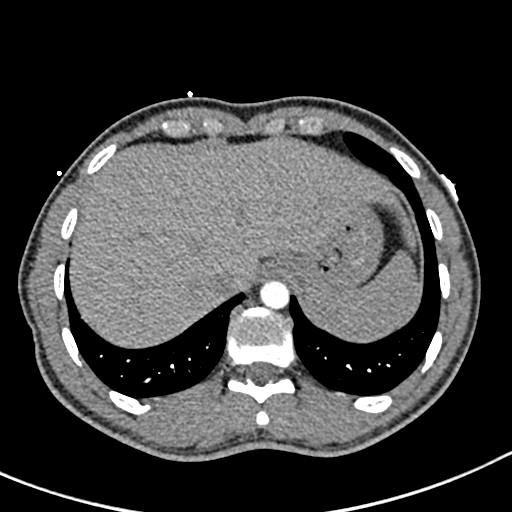
[im 92/301  lung]
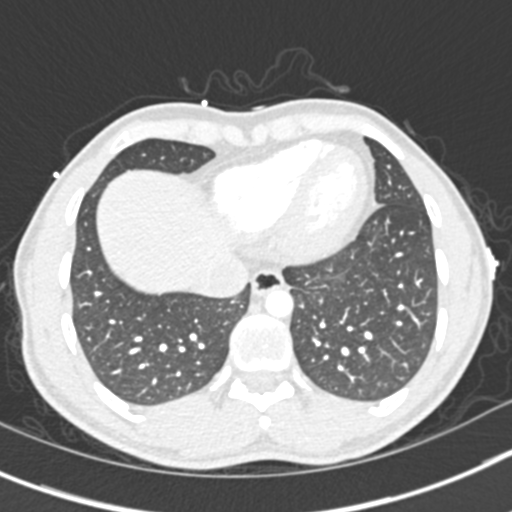
[im 105/301  soft-tissue]
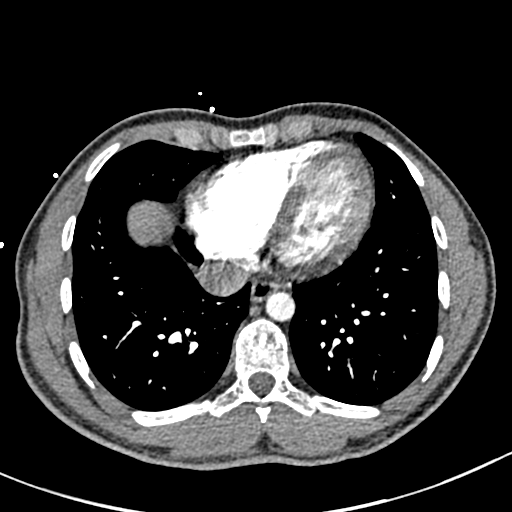
[im 118/301  lung]
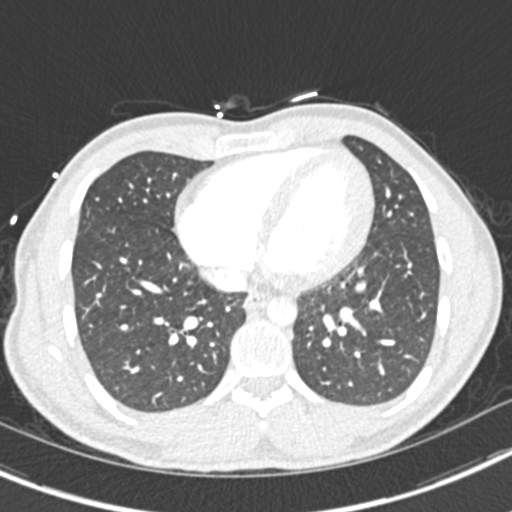
[im 144/301  soft-tissue]
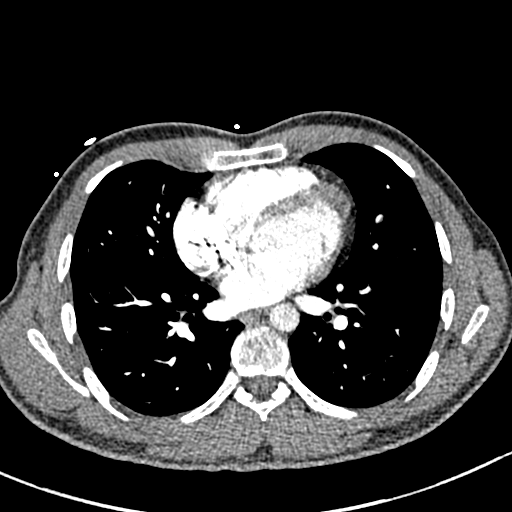
[im 157/301  lung]
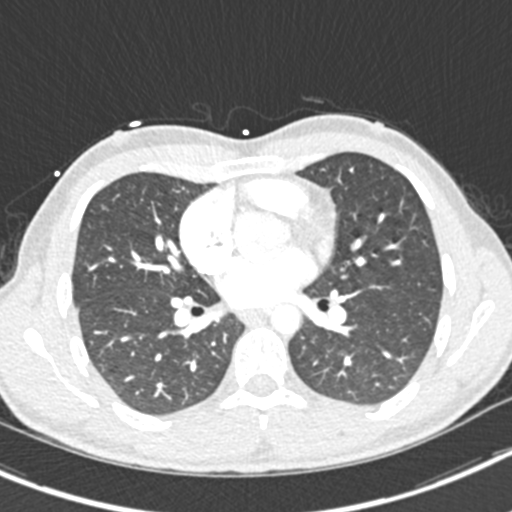
[im 183/301  soft-tissue]
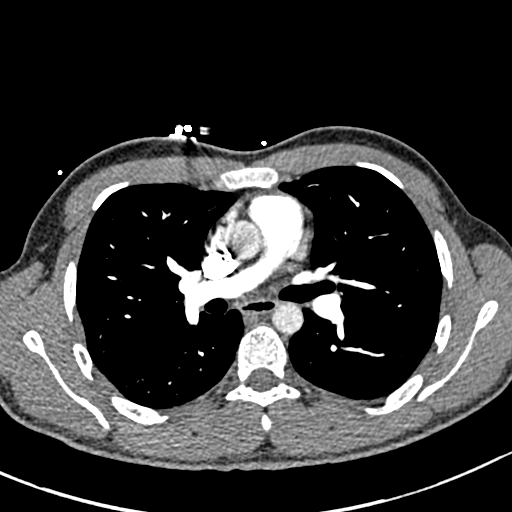
[im 196/301  lung]
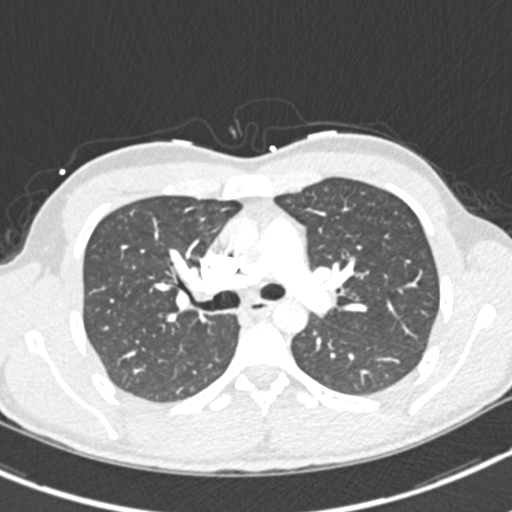
[im 209/301  soft-tissue]
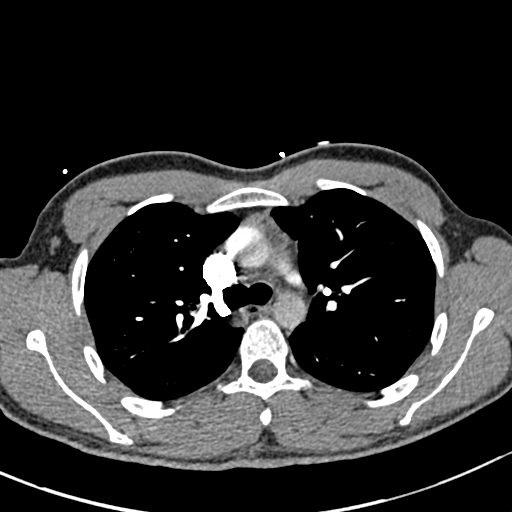
[im 235/301  lung]
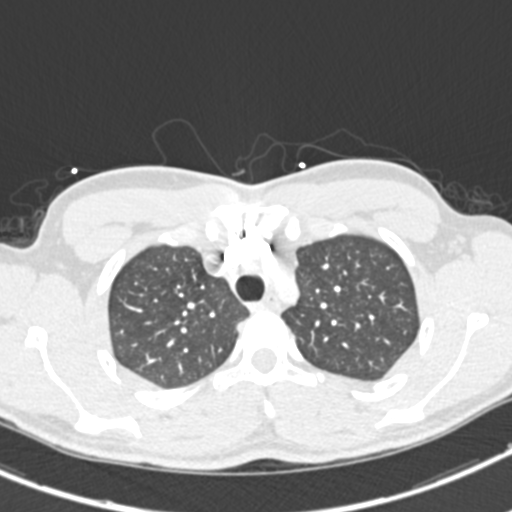
[im 248/301  soft-tissue]
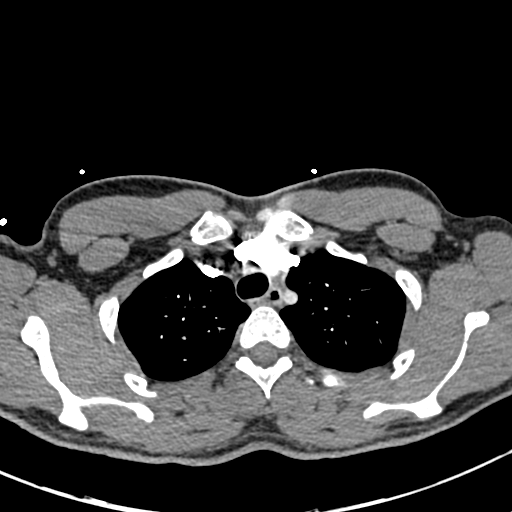
[im 261/301  lung]
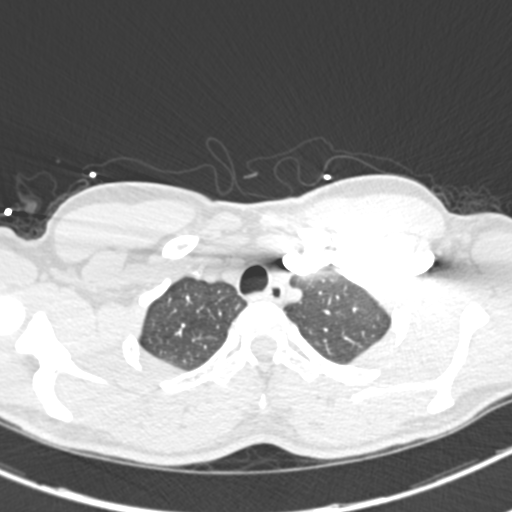
[im 287/301  soft-tissue]
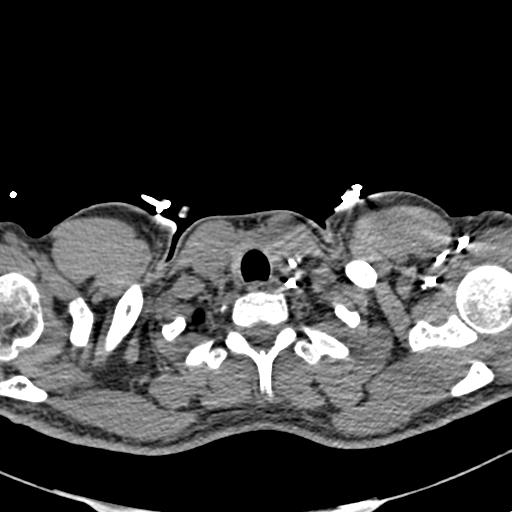

[Series 7: coronal mpr · coronal · 0.62mm/px · 3 of 76 slices shown]
[im 19/76  soft-tissue]
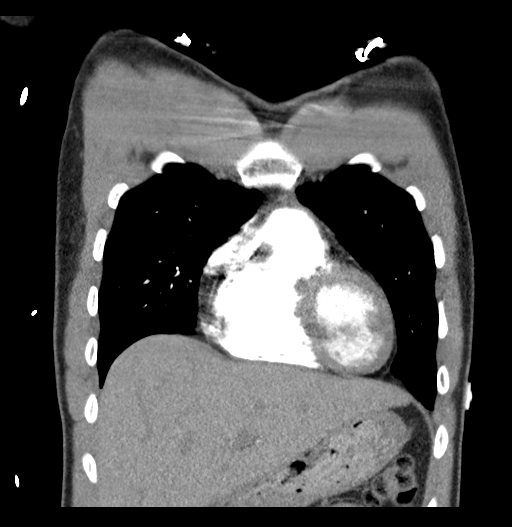
[im 38/76  soft-tissue]
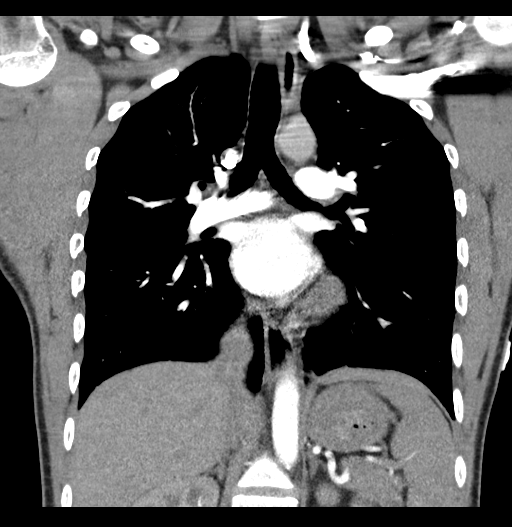
[im 57/76  soft-tissue]
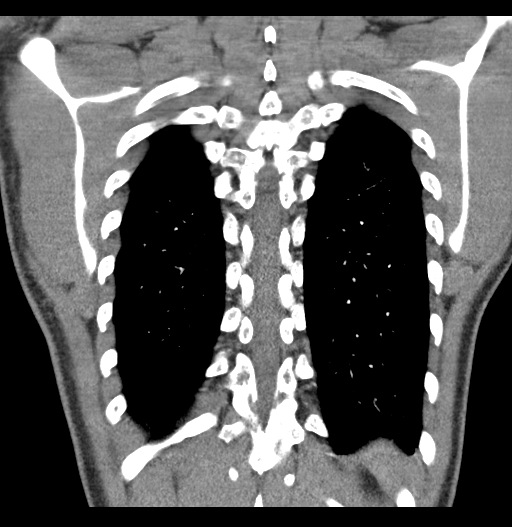

[19 of 46 positions shown; findings below may reference images not displayed]

FINDINGS: Cardiovascular: Satisfactory opacification of the pulmonary arteries
to the segmental level. No evidence of pulmonary embolism. Normal
heart size. No pericardial effusion. Nonaneurysmal aorta.

Mediastinum/Nodes: No enlarged mediastinal, hilar, or axillary lymph
nodes. Thyroid gland, trachea, and esophagus demonstrate no
significant findings.

Lungs/Pleura: Lungs are clear. No pleural effusion or pneumothorax.

Upper Abdomen: No acute abnormality.

Musculoskeletal: No chest wall abnormality. No acute or significant
osseous findings.

Review of the MIP images confirms the above findings.
IMPRESSION: Negative. No CT evidence for acute pulmonary embolus. Clear lung
fields

## 2021-10-08 ENCOUNTER — Encounter: Payer: Self-pay | Admitting: Emergency Medicine

## 2021-10-08 ENCOUNTER — Other Ambulatory Visit: Payer: Self-pay

## 2021-10-08 DIAGNOSIS — U071 COVID-19: Secondary | ICD-10-CM | POA: Insufficient documentation

## 2021-10-08 NOTE — ED Triage Notes (Signed)
Pt reports he tested positive for COVID x 1 day ago and is c/o shortness of breath, O2 sats 97% in triage, no distress noted

## 2021-10-09 ENCOUNTER — Emergency Department
Admission: EM | Admit: 2021-10-09 | Discharge: 2021-10-09 | Disposition: A | Discharge status: Home / Self Care | Payer: Self-pay | Attending: Emergency Medicine | Admitting: Emergency Medicine

## 2021-10-09 DIAGNOSIS — U071 COVID-19: Secondary | ICD-10-CM

## 2021-10-09 LAB — RESP PANEL BY RT-PCR (FLU A&B, COVID) ARPGX2
Influenza A by PCR: NEGATIVE
Influenza B by PCR: NEGATIVE
SARS Coronavirus 2 by RT PCR: NEGATIVE

## 2021-10-09 MED ORDER — NIRMATRELVIR/RITONAVIR (PAXLOVID)TABLET: 3.0000 | ORAL_TABLET | Freq: Two times a day (BID) | ORAL | 0 refills | Status: AC

## 2021-10-09 NOTE — ED Notes (Signed)
Pt sitting in lobby in no acute distress texting on ph one

## 2021-10-09 NOTE — ED Provider Notes (Signed)
° °  [facility] Provider Note    Event Date/Time   First MD Initiated Contact with Patient 10/09/21 0246     (approximate)  History   Chief Complaint: Shortness of Breath  HPI  SOTA HETZ is a 26 y.o. male with no past medical history presents to the emergency department for a positive COVID test.  According to the patient over the past 2 days he has began feeling very fatigued and developed diarrhea.  States several coworkers tested positive for COVID so he tested himself tonight and it was positive.  Patient denies any significant cough does state mild congestion.  No known fever.  Patient states he has had COVID twice previously the last of which was approximately 3 to 4 months ago.  Physical Exam   Triage Vital Signs: ED Triage Vitals  Enc Vitals Group     BP 10/08/21 2146 105/78     Pulse Rate 10/08/21 2146 82     Resp 10/08/21 2146 18     Temp 10/08/21 2146 98.6 F (37 C)     Temp Source 10/08/21 2146 Oral     SpO2 10/08/21 2146 98 %     Weight 10/08/21 2152 140 lb (63.5 kg)     Height 10/08/21 2152 5\' 11"  (1.803 m)     Head Circumference --      Peak Flow --      Pain Score 10/08/21 2152 0     Pain Loc --      Pain Edu? --      Excl. in GC? --     Most recent vital signs: Vitals:   10/08/21 2146  BP: 105/78  Pulse: 82  Resp: 18  Temp: 98.6 F (37 C)  SpO2: 98%    General: Awake, no distress.  CV:  Good peripheral perfusion.  Regular rate and rhythm  Resp:  Normal effort.  Equal breath sounds bilaterally.  No wheeze rales or rhonchi. Abd:  No distention.  Soft, nontender.  No rebound or guarding.   ED Results / Procedures / Treatments   MEDICATIONS ORDERED IN ED: Medications - No data to display   IMPRESSION / MDM / ASSESSMENT AND PLAN / ED COURSE  I reviewed the triage vital signs and the nursing notes.  Patient presents emergency department for 2 days of fatigue diarrhea and a positive home COVID test.  Patient states he  was feeling mildly short of breath.  Clear lung sounds on my evaluation.  Normal respiratory rate.  98% on room air.  Given the clear lung sounds and overall mild symptoms I believe the patient is safe for discharge home.  We will cover with Paxlovid.  We will perform a PCR COVID test which the patient will follow-up on MyChart however given his symptoms and the positive home antigen test I discussed the very high likelihood of a positive PCR test as well.  Discussed my typical COVID return precautions.  FINAL CLINICAL IMPRESSION(S) / ED DIAGNOSES   COVID-19  Rx / DC Orders   Paxlovid  Note:  This document was prepared using Dragon voice recognition software and may include unintentional dictation errors.   2147, MD 10/09/21 [phone]

## 2022-01-06 IMAGING — CR DG ABDOMEN 1V
1 series · 2 of 2 positions shown · non-contrast
Comparison: None.

CLINICAL DATA: Constipation with abdominal cramping

EXAM:
ABDOMEN - 1 VIEW

[Series 1: dg abd 1 view · 0.14mm/px · 2 of 2 slices shown]
[im 1/2]
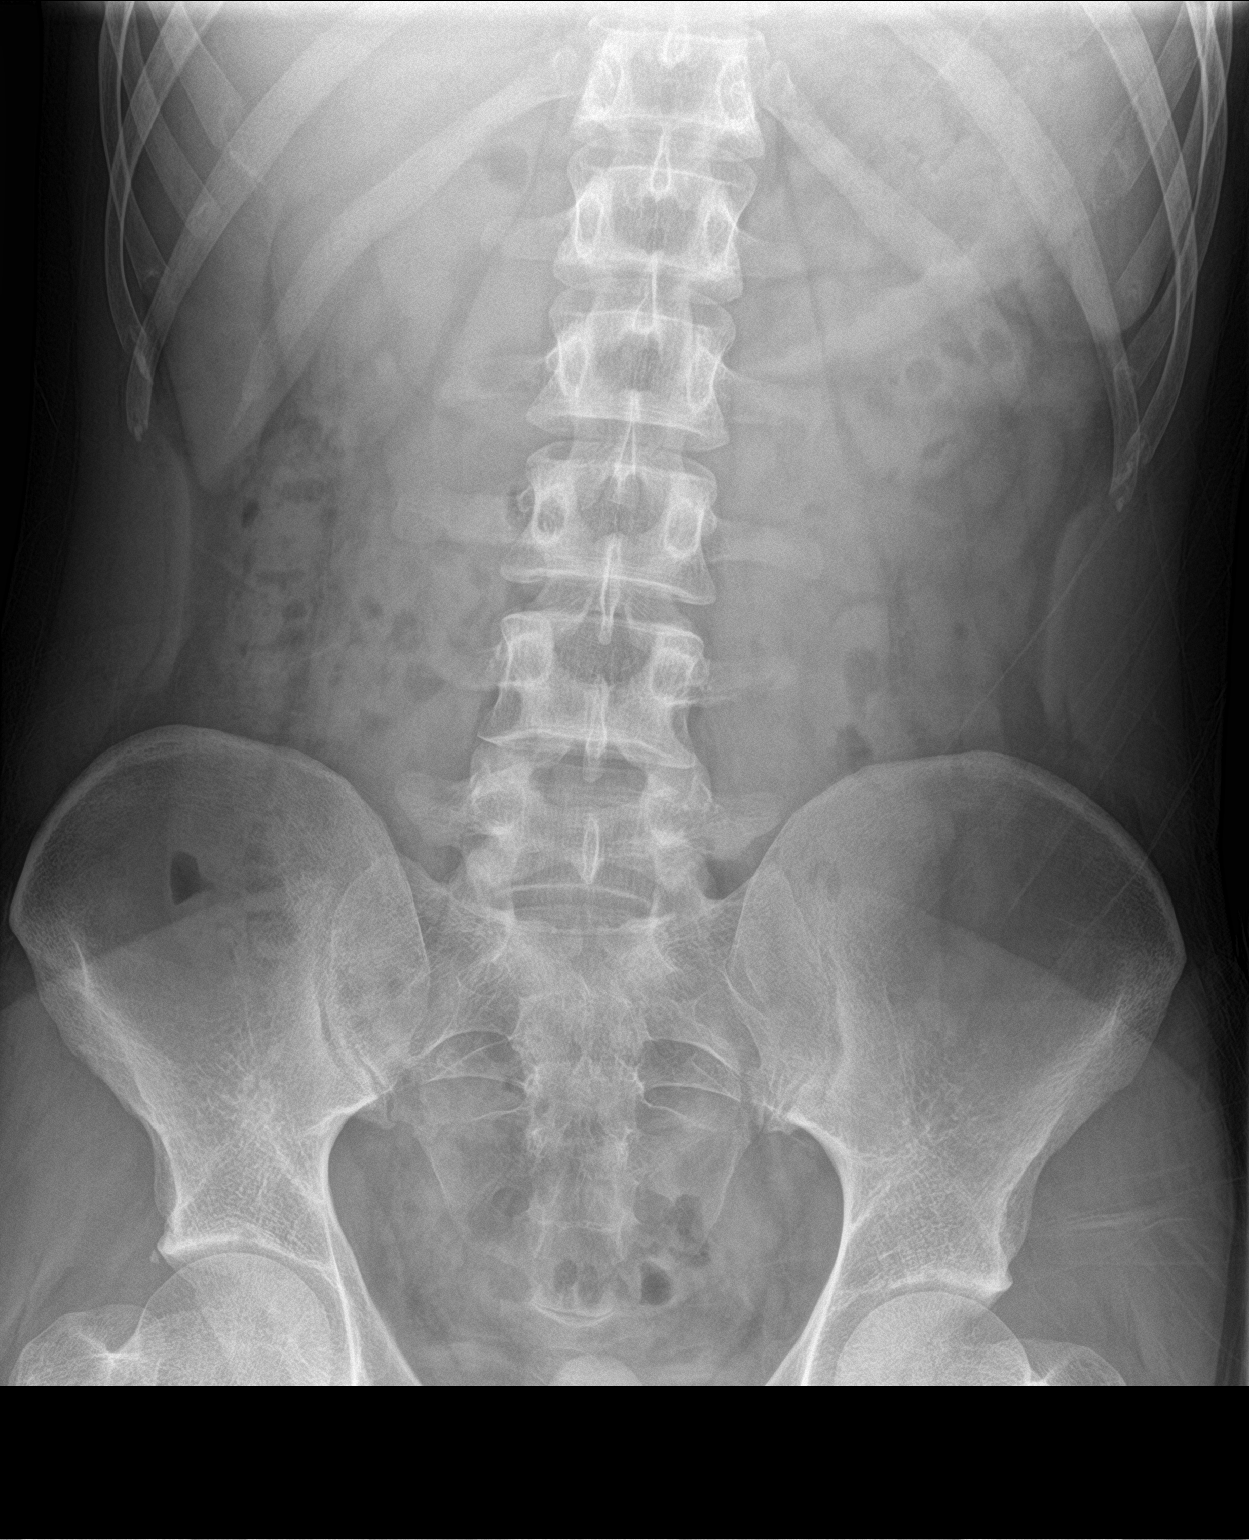
[im 2/2]
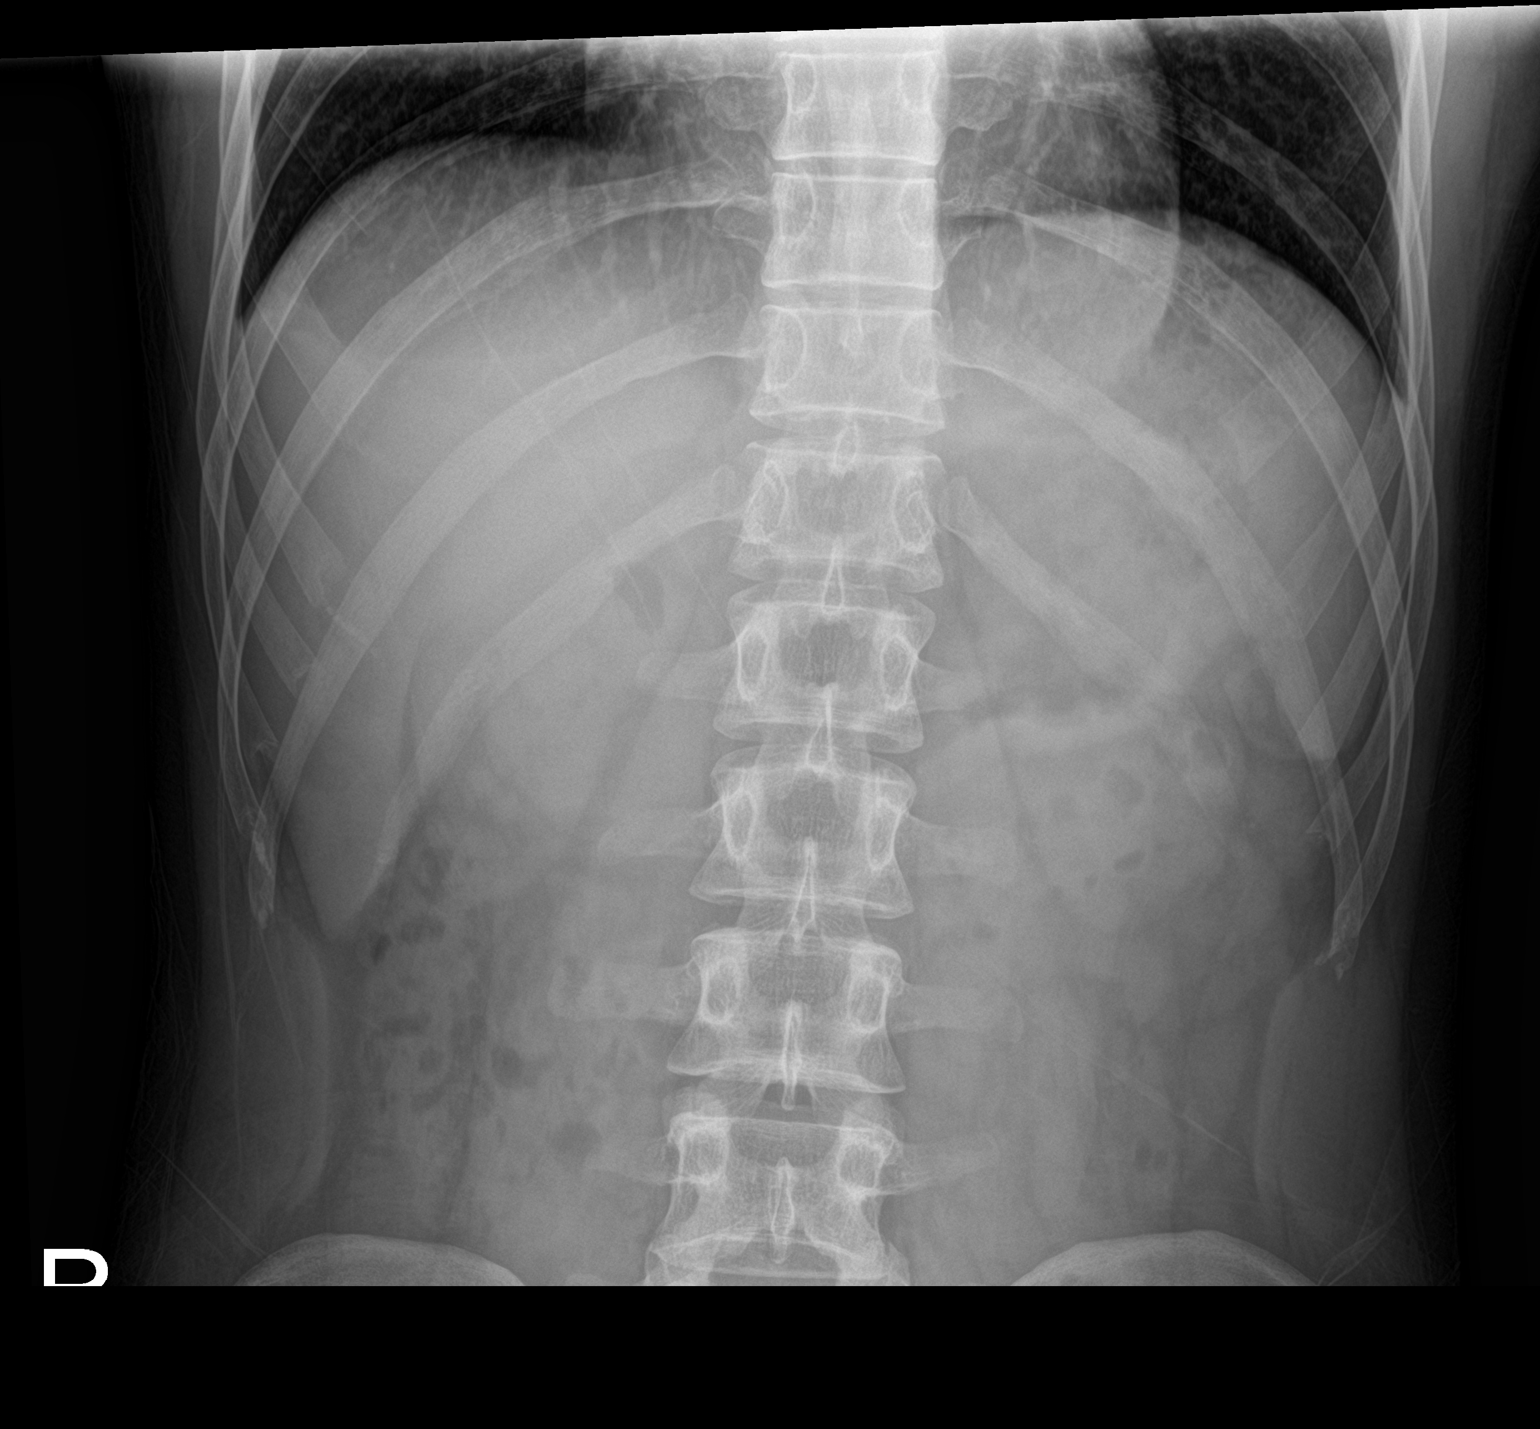

[2 of 2 positions shown; findings below may reference images not displayed]

FINDINGS: There is moderate stool in the colon. There is no bowel dilatation
or air-fluid level to suggest bowel obstruction. No free air. No
abnormal calcifications. Visualized lung bases clear.
IMPRESSION: Moderate stool in colon. No bowel obstruction or free air.
Visualized lung bases clear.
